# Patient Record
Sex: Male | Born: 1985 | Race: White | Hispanic: No | Marital: Married | Smoking: Never smoker
Health system: Southern US, Community
[De-identification: ages and names within clinical notes are randomized; demographics above are authoritative.]

## PROBLEM LIST (undated history)

## (undated) DIAGNOSIS — J45909 Unspecified asthma, uncomplicated: Secondary | ICD-10-CM

---
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
## 2015-11-27 ENCOUNTER — Emergency Department: Payer: Self-pay

## 2015-11-27 ENCOUNTER — Encounter: Payer: Self-pay | Admitting: Emergency Medicine

## 2015-11-27 ENCOUNTER — Emergency Department
Admission: EM | Admit: 2015-11-27 | Discharge: 2015-11-27 | Disposition: A | Discharge status: Home / Self Care | Payer: Self-pay | Attending: Emergency Medicine | Admitting: Emergency Medicine

## 2015-11-27 DIAGNOSIS — W19XXXA Unspecified fall, initial encounter: Secondary | ICD-10-CM

## 2015-11-27 DIAGNOSIS — W010XXA Fall on same level from slipping, tripping and stumbling without subsequent striking against object, initial encounter: Secondary | ICD-10-CM | POA: Insufficient documentation

## 2015-11-27 DIAGNOSIS — Y999 Unspecified external cause status: Secondary | ICD-10-CM | POA: Insufficient documentation

## 2015-11-27 DIAGNOSIS — J45909 Unspecified asthma, uncomplicated: Secondary | ICD-10-CM | POA: Insufficient documentation

## 2015-11-27 DIAGNOSIS — Y92511 Restaurant or cafe as the place of occurrence of the external cause: Secondary | ICD-10-CM | POA: Insufficient documentation

## 2015-11-27 DIAGNOSIS — Y9301 Activity, walking, marching and hiking: Secondary | ICD-10-CM | POA: Insufficient documentation

## 2015-11-27 DIAGNOSIS — S300XXA Contusion of lower back and pelvis, initial encounter: Secondary | ICD-10-CM | POA: Insufficient documentation

## 2015-11-27 DIAGNOSIS — M546 Pain in thoracic spine: Secondary | ICD-10-CM

## 2015-11-27 HISTORY — DX: Unspecified asthma, uncomplicated: J45.909

## 2015-11-27 MED ORDER — NAPROXEN 500 MG PO TABS: 500.0000 mg | ORAL_TABLET | Freq: Two times a day (BID) | ORAL | Status: DC

## 2015-11-27 MED ORDER — BACLOFEN 10 MG PO TABS: 10.0000 mg | ORAL_TABLET | Freq: Three times a day (TID) | ORAL | Status: DC

## 2015-11-27 NOTE — ED Notes (Signed)
Patient presents to the ED with neck and lower back pain.  Patient states he was at a restaurant in [place] named [employer] and they used a strange cleaning product on the floor that made it very slippery and patient slipped and fell on his back.  Patient denies hitting his head or losing consciousness.  Patient is in no obvious distress at this time.

## 2015-11-27 NOTE — ED Provider Notes (Signed)
[facility] Emergency Department Provider Note  ____________________________________________  Time seen: Approximately 12:19 PM  I have reviewed the triage vital signs and the nursing notes.   HISTORY  Chief Complaint Fall    HPI Richard Roman is a 30 y.o. male reports that he slipped and local restaurant last night while walking to the restroom. Patient states that he landed with his arms outstretched backwards hitting his upper back and lower back. States that he went home took no medication did not try heat and reports today with complaints of upper thoracic and upper lumbar pain. Denies any numbness tingling or radiation of that pain.   Past Medical History  Diagnosis Date  . Asthma     There are no active problems to display for this patient.   History reviewed. No pertinent past surgical history.  Current Outpatient Rx  Name  Route  Sig  Dispense  Refill  . baclofen (LIORESAL) 10 MG tablet   Oral   Take 1 tablet (10 mg total) by mouth 3 (three) times daily.   30 tablet   0   . naproxen (NAPROSYN) 500 MG tablet   Oral   Take 1 tablet (500 mg total) by mouth 2 (two) times daily with a meal.   60 tablet   0     Allergies Codeine  No family history on file.  Social History Social History  Substance Use Topics  . Smoking status: Never Smoker   . Smokeless tobacco: None  . Alcohol Use: No    Review of Systems Constitutional: No fever/chills Cardiovascular: Denies chest pain. Respiratory: Denies shortness of breath. Gastrointestinal: No abdominal pain.  No nausea, no vomiting.  No diarrhea.  No constipation. Genitourinary: Negative for dysuria. Musculoskeletal: Positive for back pain Skin: Negative for rash. Neurological: Negative for headaches, focal weakness or numbness.  10-point ROS otherwise negative.  ____________________________________________   PHYSICAL EXAM:  VITAL SIGNS: ED Triage Vitals  Enc Vitals  Group     BP 11/27/15 1203 147/105 mmHg     Pulse Rate 11/27/15 1203 83     Resp 11/27/15 1203 20     Temp 11/27/15 1203 98.6 F (37 C)     Temp Source 11/27/15 1203 Oral     SpO2 11/27/15 1203 98 %     Weight 11/27/15 1203 189 lb (85.73 kg)     Height 11/27/15 1203  (1.88 m)     Head Cir --      Peak Flow --      Pain Score 11/27/15 1204 7     Pain Loc --      Pain Edu? --      Excl. in GC? --     Constitutional: Alert and oriented. Well appearing and in no acute distress. Neck: No stridor.   Cardiovascular: Normal rate, regular rhythm. Grossly normal heart sounds.  Good peripheral circulation. Respiratory: Normal respiratory effort.  No retractions. Lungs CTAB. Gastrointestinal: Soft and nontender. No distention. No abdominal bruits. No CVA tenderness. Musculoskeletal: No ecchymosis or bruising noted to the back region. Point tenderness noted to the upper thoracic and lower middle lumbar area. Straight leg raise positive ambulates methodically. Neurologic:  Normal speech and language. No gross focal neurologic deficits are appreciated. No gait instability, walks methodically.. Skin:  Skin is warm, dry and intact. No rash noted. Psychiatric: Mood and affect are normal. Speech and behavior are normal.  ____________________________________________   LABS (all labs ordered are listed, but only abnormal  results are displayed)  Labs Reviewed - No data to display   RADIOLOGY  No acute osseous findings. ____________________________________________   PROCEDURES  Procedure(s) performed: None  Critical Care performed: No  ____________________________________________   INITIAL IMPRESSION / ASSESSMENT AND PLAN / ED COURSE  Pertinent labs & imaging results that were available during my care of the patient were reviewed by me and considered in my medical decision making (see chart for details).  Status post fall with thoracic and lumbar contusion. Rx given for baclofen  10 mg 3 times a day Naprosyn 500 mg twice a day. Encourage use of a heating pad and follow up with PCP as needed. Patient voices no other emergency medical complaints at this time. ____________________________________________   FINAL CLINICAL IMPRESSION(S) / ED DIAGNOSES  Final diagnoses:  Fall, initial encounter  Lumbar contusion, initial encounter  Midline thoracic back pain     This chart was dictated using voice recognition software/Dragon. Despite best efforts to proofread, errors can occur which can change the meaning. Any change was purely unintentional.   Evangeline Dakinharles M Porschea Borys, PA-C 11/27/15 1308  Emily FilbertJonathan E Williams, MD 11/27/15 [phone]

## 2015-11-27 NOTE — Discharge Instructions (Signed)
Back Pain, Adult °Back pain is very common in adults. The cause of back pain is rarely dangerous and the pain often gets better over time. The cause of your back pain may not be known. Some common causes of back pain include: °· Strain of the muscles or ligaments supporting the spine. °· Wear and tear (degeneration) of the spinal disks. °· Arthritis. °· Direct injury to the back. °For many people, back pain may return. Since back pain is rarely dangerous, most people can learn to manage this condition on their own. °HOME CARE INSTRUCTIONS °Watch your back pain for any changes. The following actions may help to lessen any discomfort you are feeling: °· Remain active. It is stressful on your back to sit or stand in one place for long periods of time. Do not sit, drive, or stand in one place for more than 30 minutes at a time. Take short walks on even surfaces as soon as you are able. Try to increase the length of time you walk each day. °· Exercise regularly as directed by your health care provider. Exercise helps your back heal faster. It also helps avoid future injury by keeping your muscles strong and flexible. °· Do not stay in bed. Resting more than 1-2 days can delay your recovery. °· Pay attention to your body when you bend and lift. The most comfortable positions are those that put less stress on your recovering back. Always use proper lifting techniques, including: °· Bending your knees. °· Keeping the load close to your body. °· Avoiding twisting. °· Find a comfortable position to sleep. Use a firm mattress and lie on your side with your knees slightly bent. If you lie on your back, put a pillow under your knees. °· Avoid feeling anxious or stressed. Stress increases muscle tension and can worsen back pain. It is important to recognize when you are anxious or stressed and learn ways to manage it, such as with exercise. °· Take medicines only as directed by your health care provider. Over-the-counter  medicines to reduce pain and inflammation are often the most helpful. Your health care provider may prescribe muscle relaxant drugs. These medicines help dull your pain so you can more quickly return to your normal activities and healthy exercise. °· Apply ice to the injured area: °· Put ice in a plastic bag. °· Place a towel between your skin and the bag. °· Leave the ice on for 20 minutes, 2-3 times a day for the first 2-3 days. After that, ice and heat may be alternated to reduce pain and spasms. °· Maintain a healthy weight. Excess weight puts extra stress on your back and makes it difficult to maintain good posture. °SEEK MEDICAL CARE IF: °· You have pain that is not relieved with rest or medicine. °· You have increasing pain going down into the legs or buttocks. °· You have pain that does not improve in one week. °· You have night pain. °· You lose weight. °· You have a fever or chills. °SEEK IMMEDIATE MEDICAL CARE IF:  °· You develop new bowel or bladder control problems. °· You have unusual weakness or numbness in your arms or legs. °· You develop nausea or vomiting. °· You develop abdominal pain. °· You feel faint. °  °This information is not intended to replace advice given to you by your health care provider. Make sure you discuss any questions you have with your health care provider. °  °Document Released: 08/11/2005 Document Revised: 09/01/2014 Document Reviewed: 12/13/2013 °Elsevier Interactive Patient Education ©2016 Elsevier   Inc.  Contusion A contusion is a deep bruise. Contusions are the result of a blunt injury to tissues and muscle fibers under the skin. The injury causes bleeding under the skin. The skin overlying the contusion may turn blue, purple, or yellow. Minor injuries will give you a painless contusion, but more severe contusions may stay painful and swollen for a few weeks.  CAUSES  This condition is usually caused by a blow, trauma, or direct force to an area of the body. SYMPTOMS   Symptoms of this condition include:  Swelling of the injured area.  Pain and tenderness in the injured area.  Discoloration. The area may have redness and then turn blue, purple, or yellow. DIAGNOSIS  This condition is diagnosed based on a physical exam and medical history. An X-ray, CT scan, or MRI may be needed to determine if there are any associated injuries, such as broken bones (fractures). TREATMENT  Specific treatment for this condition depends on what area of the body was injured. In general, the best treatment for a contusion is resting, icing, applying pressure to (compression), and elevating the injured area. This is often called the RICE strategy. Over-the-counter anti-inflammatory medicines may also be recommended for pain control.  HOME CARE INSTRUCTIONS   Rest the injured area.  If directed, apply ice to the injured area:  Put ice in a plastic bag.  Place a towel between your skin and the bag.  Leave the ice on for 20 minutes, 2-3 times per day.  If directed, apply light compression to the injured area using an elastic bandage. Make sure the bandage is not wrapped too tightly. Remove and reapply the bandage as directed by your health care provider.  If possible, raise (elevate) the injured area above the level of your heart while you are sitting or lying down.  Take over-the-counter and prescription medicines only as told by your health care provider. SEEK MEDICAL CARE IF:  Your symptoms do not improve after several days of treatment.  Your symptoms get worse.  You have difficulty moving the injured area. SEEK IMMEDIATE MEDICAL CARE IF:   You have severe pain.  You have numbness in a hand or foot.  Your hand or foot turns pale or cold.   This information is not intended to replace advice given to you by your health care provider. Make sure you discuss any questions you have with your health care provider.   Document Released: 05/21/2005 Document Revised:  05/02/2015 Document Reviewed: 12/27/2014 Elsevier Interactive Patient Education 2016 Elsevier Inc.  [employer]ockerHeat Therapy Heat therapy can help ease sore, stiff, injured, and tight muscles and joints. Heat relaxes your muscles, which may help ease your pain.  RISKS AND COMPLICATIONS If you have any of the following conditions, do not use heat therapy unless your health care provider has approved:  Poor circulation.  Healing wounds or scarred skin in the area being treated.  Diabetes, heart disease, or high blood pressure.  Not being able to feel (numbness) the area being treated.  Unusual swelling of the area being treated.  Active infections.  Blood clots.  Cancer.  Inability to communicate pain. This may include young children and people who have problems with their brain function (dementia).  Pregnancy. Heat therapy should only be used on old, pre-existing, or long-lasting (chronic) injuries. Do not use heat therapy on new injuries unless directed by your health care provider. HOW TO USE HEAT THERAPY There are several different kinds of heat therapy, including:  Moist  heat pack.  Warm water bath.  Hot water bottle.  Electric heating pad.  Heated gel pack.  Heated wrap.  Electric heating pad. Use the heat therapy method suggested by your health care provider. Follow your health care provider's instructions on when and how to use heat therapy. GENERAL HEAT THERAPY RECOMMENDATIONS  Do not sleep while using heat therapy. Only use heat therapy while you are awake.  Your skin may turn pink while using heat therapy. Do not use heat therapy if your skin turns red.  Do not use heat therapy if you have new pain.  High heat or long exposure to heat can cause burns. Be careful when using heat therapy to avoid burning your skin.  Do not use heat therapy on areas of your skin that are already irritated, such as with a rash or sunburn. SEEK MEDICAL CARE IF:  You have blisters,  redness, swelling, or numbness.  You have new pain.  Your pain is worse. MAKE SURE YOU:  Understand these instructions.  Will watch your condition.  Will get help right away if you are not doing well or get worse.   This information is not intended to replace advice given to you by your health care provider. Make sure you discuss any questions you have with your health care provider.   Document Released: 11/03/2011 Document Revised: 09/01/2014 Document Reviewed: 10/04/2013 Elsevier Interactive Patient Education 2016 Elsevier Inc.  Cryotherapy Cryotherapy is when you put ice on your injury. Ice helps lessen pain and puffiness (swelling) after an injury. Ice works the best when you start using it in the first 24 to 48 hours after an injury. HOME CARE  Put a dry or damp towel between the ice pack and your skin.  You may press gently on the ice pack.  Leave the ice on for no more than 10 to 20 minutes at a time.  Check your skin after 5 minutes to make sure your skin is okay.  Rest at least 20 minutes between ice pack uses.  Stop using ice when your skin loses feeling (numbness).  Do not use ice on someone who cannot tell you when it hurts. This includes small children and people with memory problems (dementia). GET HELP RIGHT AWAY IF:  You have white spots on your skin.  Your skin turns blue or pale.  Your skin feels waxy or hard.  Your puffiness gets worse. MAKE SURE YOU:   Understand these instructions.  Will watch your condition.  Will get help right away if you are not doing well or get worse.   This information is not intended to replace advice given to you by your health care provider. Make sure you discuss any questions you have with your health care provider.   Document Released: 01/28/2008 Document Revised: 11/03/2011 Document Reviewed: 04/03/2011 Elsevier Interactive Patient Education [employer]

## 2016-09-18 IMAGING — CR DG THORACIC SPINE 2V
1 series · 4 of 4 positions shown · non-contrast
Comparison: None.

CLINICAL DATA: Acute upper thoracic spine after fall. Initial
encounter.

EXAM:
THORACIC SPINE 2 VIEWS

[Series 1: t thoracic spine ap · 0.14mm/px · 4 of 4 slices shown]
[im 1/4]
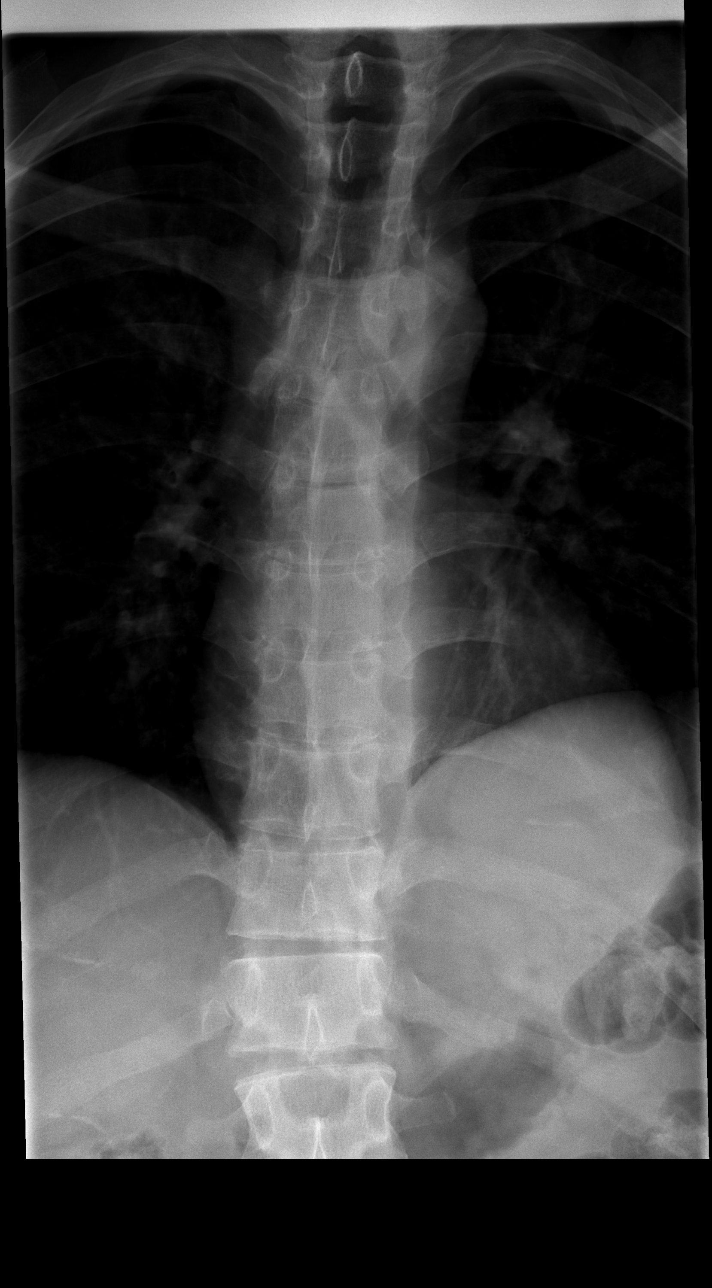
[im 2/4]
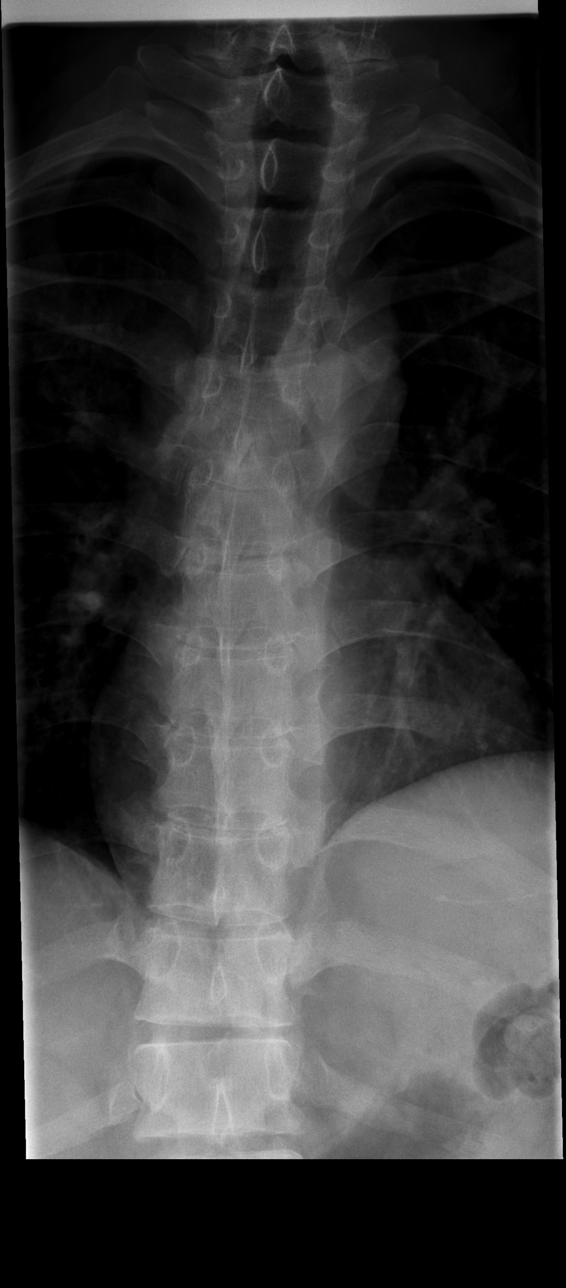
[im 3/4]
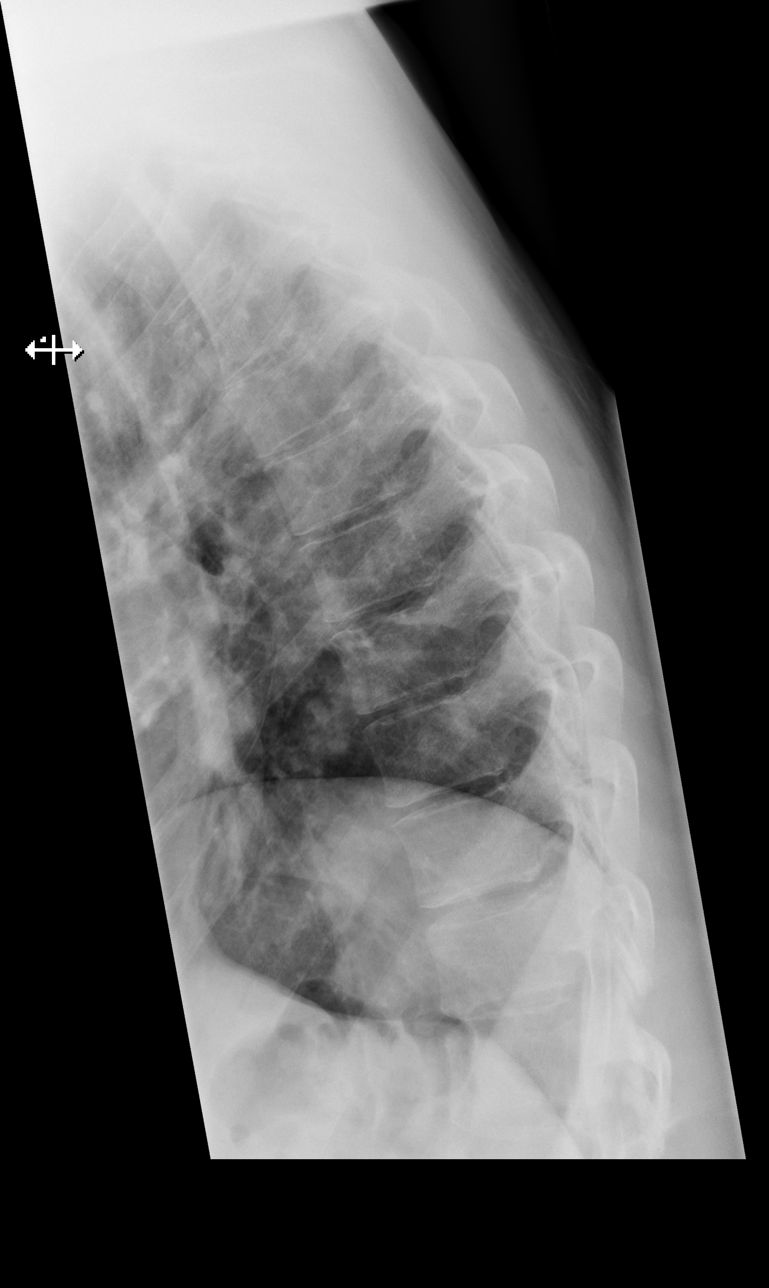
[im 4/4]
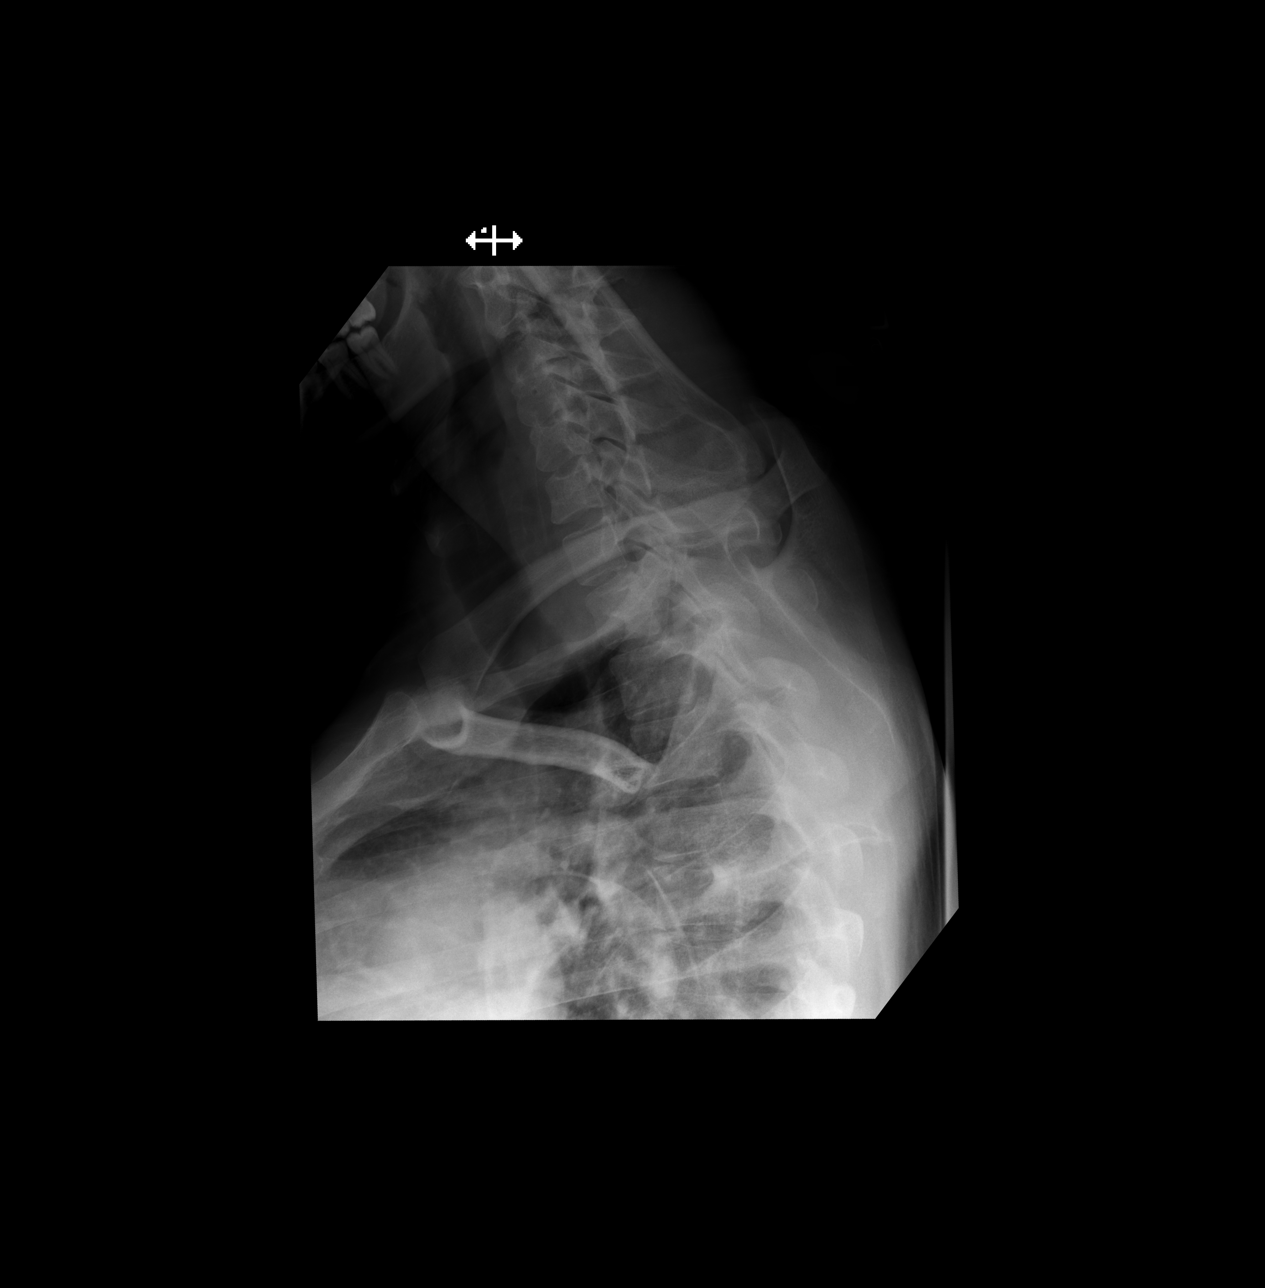

[4 of 4 positions shown; findings below may reference images not displayed]

FINDINGS: There is no evidence of thoracic spine fracture. Alignment is
normal. No other significant bone abnormalities are identified.
IMPRESSION: Normal thoracic spine.

## 2016-11-24 DIAGNOSIS — H1713 Central corneal opacity, bilateral: Secondary | ICD-10-CM | POA: Diagnosis not present

## 2016-11-24 DIAGNOSIS — H11013 Amyloid pterygium of eye, bilateral: Secondary | ICD-10-CM | POA: Diagnosis not present

## 2016-11-24 DIAGNOSIS — H209 Unspecified iridocyclitis: Secondary | ICD-10-CM | POA: Diagnosis not present

## 2016-11-25 DIAGNOSIS — H11003 Unspecified pterygium of eye, bilateral: Secondary | ICD-10-CM | POA: Diagnosis not present

## 2016-11-25 DIAGNOSIS — H21541 Posterior synechiae (iris), right eye: Secondary | ICD-10-CM | POA: Diagnosis not present

## 2016-11-25 DIAGNOSIS — H30033 Focal chorioretinal inflammation, peripheral, bilateral: Secondary | ICD-10-CM | POA: Diagnosis not present

## 2016-11-25 DIAGNOSIS — H2513 Age-related nuclear cataract, bilateral: Secondary | ICD-10-CM | POA: Diagnosis not present

## 2016-12-01 DIAGNOSIS — H18453 Nodular corneal degeneration, bilateral: Secondary | ICD-10-CM | POA: Diagnosis not present

## 2016-12-01 DIAGNOSIS — H11003 Unspecified pterygium of eye, bilateral: Secondary | ICD-10-CM | POA: Diagnosis not present

## 2016-12-09 DIAGNOSIS — H21541 Posterior synechiae (iris), right eye: Secondary | ICD-10-CM | POA: Diagnosis not present

## 2016-12-09 DIAGNOSIS — H30033 Focal chorioretinal inflammation, peripheral, bilateral: Secondary | ICD-10-CM | POA: Diagnosis not present

## 2016-12-09 DIAGNOSIS — H2513 Age-related nuclear cataract, bilateral: Secondary | ICD-10-CM | POA: Diagnosis not present

## 2016-12-09 DIAGNOSIS — H11003 Unspecified pterygium of eye, bilateral: Secondary | ICD-10-CM | POA: Diagnosis not present

## 2016-12-25 DIAGNOSIS — H11013 Amyloid pterygium of eye, bilateral: Secondary | ICD-10-CM | POA: Diagnosis not present

## 2016-12-25 DIAGNOSIS — H209 Unspecified iridocyclitis: Secondary | ICD-10-CM | POA: Diagnosis not present

## 2016-12-25 DIAGNOSIS — H1713 Central corneal opacity, bilateral: Secondary | ICD-10-CM | POA: Diagnosis not present

## 2016-12-30 DIAGNOSIS — H21541 Posterior synechiae (iris), right eye: Secondary | ICD-10-CM | POA: Diagnosis not present

## 2016-12-30 DIAGNOSIS — H30033 Focal chorioretinal inflammation, peripheral, bilateral: Secondary | ICD-10-CM | POA: Diagnosis not present

## 2016-12-30 DIAGNOSIS — H2513 Age-related nuclear cataract, bilateral: Secondary | ICD-10-CM | POA: Diagnosis not present

## 2016-12-30 DIAGNOSIS — H11003 Unspecified pterygium of eye, bilateral: Secondary | ICD-10-CM | POA: Diagnosis not present

## 2016-12-31 DIAGNOSIS — H2513 Age-related nuclear cataract, bilateral: Secondary | ICD-10-CM | POA: Diagnosis not present

## 2016-12-31 DIAGNOSIS — H40042 Steroid responder, left eye: Secondary | ICD-10-CM | POA: Diagnosis not present

## 2016-12-31 DIAGNOSIS — H18453 Nodular corneal degeneration, bilateral: Secondary | ICD-10-CM | POA: Diagnosis not present

## 2017-01-20 DIAGNOSIS — F909 Attention-deficit hyperactivity disorder, unspecified type: Secondary | ICD-10-CM | POA: Diagnosis not present

## 2017-01-20 DIAGNOSIS — H209 Unspecified iridocyclitis: Secondary | ICD-10-CM | POA: Diagnosis not present

## 2017-01-20 DIAGNOSIS — M1280 Other specific arthropathies, not elsewhere classified, unspecified site: Secondary | ICD-10-CM | POA: Diagnosis not present

## 2017-01-20 DIAGNOSIS — J4520 Mild intermittent asthma, uncomplicated: Secondary | ICD-10-CM | POA: Diagnosis not present

## 2017-01-22 DIAGNOSIS — H11003 Unspecified pterygium of eye, bilateral: Secondary | ICD-10-CM | POA: Diagnosis not present

## 2017-01-22 DIAGNOSIS — H21541 Posterior synechiae (iris), right eye: Secondary | ICD-10-CM | POA: Diagnosis not present

## 2017-01-22 DIAGNOSIS — H2513 Age-related nuclear cataract, bilateral: Secondary | ICD-10-CM | POA: Diagnosis not present

## 2017-01-22 DIAGNOSIS — H30033 Focal chorioretinal inflammation, peripheral, bilateral: Secondary | ICD-10-CM | POA: Diagnosis not present

## 2017-01-27 ENCOUNTER — Ambulatory Visit (INDEPENDENT_AMBULATORY_CARE_PROVIDER_SITE_OTHER): Payer: BLUE CROSS/BLUE SHIELD | Admitting: Allergy and Immunology

## 2017-01-27 ENCOUNTER — Encounter: Payer: Self-pay | Admitting: Allergy and Immunology

## 2017-01-27 ENCOUNTER — Telehealth: Payer: Self-pay | Admitting: *Deleted

## 2017-01-27 VITALS — BP 122/78 | HR 80 | Temp 97.5°F | Resp 18 | Ht 73.0 in | Wt 196.4 lb

## 2017-01-27 DIAGNOSIS — H209 Unspecified iridocyclitis: Secondary | ICD-10-CM | POA: Diagnosis not present

## 2017-01-27 DIAGNOSIS — M4690 Unspecified inflammatory spondylopathy, site unspecified: Secondary | ICD-10-CM | POA: Diagnosis not present

## 2017-01-27 DIAGNOSIS — H1010 Acute atopic conjunctivitis, unspecified eye: Secondary | ICD-10-CM

## 2017-01-27 DIAGNOSIS — H1045 Other chronic allergic conjunctivitis: Secondary | ICD-10-CM

## 2017-01-27 DIAGNOSIS — M47819 Spondylosis without myelopathy or radiculopathy, site unspecified: Secondary | ICD-10-CM

## 2017-01-27 MED ORDER — OLOPATADINE HCL 0.7 % OP SOLN: 1.0000 [drp] | Freq: Every day | OPHTHALMIC | 5 refills | Status: AC

## 2017-01-27 NOTE — Telephone Encounter (Signed)
Dee could you please refer patient to Dr. Dierdre ForthBeekman rheumatologist please per Dr Lucie LeatherKozlow

## 2017-01-27 NOTE — Progress Notes (Signed)
NEW PATIENT NOTE  Referring Provider: No ref. provider found Primary Provider: Patient, No Pcp Per Date of office visit: 01/27/2017    Subjective:   Chief Complaint:  Richard Roman (DOB: 11/30/1985) is a 31 y.o. male who presents to the clinic on 01/27/2017 with a chief complaint of Other (Chronic inflammation ) and Allergic Rhinitis  (seasonal) .  HPI: Richard Roman presents to this clinic in evaluation of possible allergies involving his eyes. Apparently he has corneal nodules and uveitis followed by a ophthalmologist who is treated him recently with systemic steroids of approximately 1 month's duration to address this issue. At the same time that he received systemic steroids he also received what sounds like high potency topical steroid eyedrops. This plan appeared to work well although it did end up raising his eye pressure quite significantly and at this point in time he is not on any immunosuppressive agents either orally or topically. 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 He does have back stiffness and back pain on a regular basis although with his prednisone everything has resolved.  He does have very itchy eyes and a lot of eye rubbing of many years duration. He has noticed some seasonal variation with worsening of this condition during the fall. He is wondering if atopic disease is contributing to some of this issue.  As well, because of his autoimmune disease which appears to be some type of spondyloarthropathy and uveitis he is wondering if there is a food trigger giving rise to this issue. He has undergone a anti-inflammatory diet and has eliminated all grains from his diet.  Past Medical History:  Diagnosis Date  . Asthma     History reviewed. No pertinent surgical history.  Allergies as of 01/27/2017      Reactions     Codeine Other (See Comments)   hallucinations      Medication List      PROAIR HFA 108 (90 Base) MCG/ACT inhaler Generic drug:  albuterol inhale 2 puffs by mouth every 4 hours if needed for wheezing   VITAMIN D PO Take by mouth.   VITAMIN E PO Take by mouth.       Review of systems negative except as noted in HPI / PMHx or noted below:  Review of Systems  Constitutional: Negative.   HENT: Negative.   Eyes: Negative.   Respiratory: Negative.   Cardiovascular: Negative.   Gastrointestinal: Negative.   Genitourinary: Negative.   Musculoskeletal: Negative.   Skin: Negative.   Neurological: Negative.   Endo/Heme/Allergies: Negative.   Psychiatric/Behavioral: Negative.     Family History  Problem Relation Age of Onset  . Asthma Mother   . Rheum arthritis Father   . Asthma Brother     Social History   Social History  . Marital status: Married    Spouse name: N/A  . Number of children: N/A  . Years of education: N/A   Occupational History  . Not on file.   Social History Main Topics  . Smoking status: Never Smoker  . Smokeless tobacco: Never Used  . Alcohol use No  . Drug use: Unknown  . Sexual activity: Not on file   Other Topics Concern  . Not on file   Social History Narrative  . No narrative on file    Environmental and Social  history  Lives in a house with a dry environment, no animals located inside the household, no carpeting in the bedroom, no plastic on the bed but plastic on the pillow, no smoking ongoing inside the household. He is a Probation officer.  Objective:   Vitals:   01/27/17 0821  BP: 122/78  Pulse: 80  Resp: 18  Temp: 97.5 F (36.4 C)   Height: 6' 1"  (185.4 cm) Weight: 196 lb 6.4 oz (89.1 kg)  Physical Exam  Constitutional: He is well-developed, well-nourished, and in no distress.  HENT:  Head: Normocephalic. Head is without right periorbital erythema and without left periorbital erythema.  Right Ear: Tympanic membrane,  external ear and ear canal normal.  Left Ear: Tympanic membrane, external ear and ear canal normal.  Nose: Nose normal. No mucosal edema or rhinorrhea.  Mouth/Throat: Uvula is midline, oropharynx is clear and moist and mucous membranes are normal. No oropharyngeal exudate.  Eyes: Conjunctivae and lids are normal. Pupils are equal, round, and reactive to light.  Neck: Trachea normal. No tracheal tenderness present. No tracheal deviation present. No thyromegaly present.  Cardiovascular: Normal rate, regular rhythm, S1 normal, S2 normal and normal heart sounds.   No murmur heard. Pulmonary/Chest: Effort normal and breath sounds normal. No stridor. No tachypnea. No respiratory distress. He has no wheezes. He has no rales. He exhibits no tenderness.  Abdominal: Soft. He exhibits no distension and no mass. There is no hepatosplenomegaly. There is no tenderness. There is no rebound and no guarding.  Musculoskeletal: He exhibits no edema or tenderness.  Lymphadenopathy:       Head (right side): No tonsillar adenopathy present.       Head (left side): No tonsillar adenopathy present.    He has no cervical adenopathy.    He has no axillary adenopathy.  Neurological: He is alert. Gait normal.  Skin: No rash noted. He is not diaphoretic. No erythema. No pallor. Nails show no clubbing.  Psychiatric: Mood and affect normal.    Diagnostics: Allergy skin tests were performed. He demonstrated hypersensitivity to grasses, trees, weeds, molds, house dust mite, and cat. He did not demonstrate any hypersensitivity against foods.  Assessment and Plan:    1. Seasonal allergic conjunctivitis   2. Uveitis   3. Spondyloarthropathy ([place])     1. Allergen avoidance measures  2. Can utilize has Pazeo one drop each eye one time per day  3. Recommend a combination of medical care directed by both ophthalmologist and rheumatologist in anticipation of starting an immunomodulatory agent  4. Further evaluation and  treatment?  5. Call with update in the next 3 weeks.  For Steven's atopic disease he can perform allergen avoidance measures and use a topical antihistamine for his conjunctival inflammation. We will see how things go regarding this issue over the course of the next month and consider further evaluation and treatment pending his response. I am concerned that he will redevelop his uveitis and spondyloarthropathy once he loses the effect that he received from his month long course of systemic steroids and I suspect that he will require some type of immunomodulatory agent. I recommended that he continue to see his ophthalmologist but also visit with a rheumatologist to address this issue.  Allena Katz, MD Allergy / Immunology [place]

## 2017-01-27 NOTE — Patient Instructions (Signed)
  1. Allergen avoidance measures  2. Can utilize has Pazeo one drop each eye one time per day  3. Recommend a combination of medical care directed by both ophthalmologist and rheumatologist in anticipation of starting an immunomodulatory agent  4. Further evaluation and treatment?  5. Call with update in the next 3 weeks.

## 2017-01-28 NOTE — Telephone Encounter (Signed)
[facility] will contact patient to set up an appt. I will call the patient to let him know his records have been sent over.   Thanks

## 2017-01-28 NOTE — Telephone Encounter (Signed)
noted 

## 2017-01-30 DIAGNOSIS — J45909 Unspecified asthma, uncomplicated: Secondary | ICD-10-CM | POA: Diagnosis not present

## 2017-01-30 DIAGNOSIS — H18453 Nodular corneal degeneration, bilateral: Secondary | ICD-10-CM | POA: Diagnosis not present

## 2017-01-30 DIAGNOSIS — H40042 Steroid responder, left eye: Secondary | ICD-10-CM | POA: Diagnosis not present

## 2017-01-30 DIAGNOSIS — Z87891 Personal history of nicotine dependence: Secondary | ICD-10-CM | POA: Diagnosis not present

## 2017-01-30 DIAGNOSIS — L905 Scar conditions and fibrosis of skin: Secondary | ICD-10-CM | POA: Diagnosis not present

## 2017-01-30 DIAGNOSIS — Z885 Allergy status to narcotic agent status: Secondary | ICD-10-CM | POA: Diagnosis not present

## 2017-01-30 DIAGNOSIS — H2513 Age-related nuclear cataract, bilateral: Secondary | ICD-10-CM | POA: Diagnosis not present

## 2017-02-10 DIAGNOSIS — H2513 Age-related nuclear cataract, bilateral: Secondary | ICD-10-CM | POA: Diagnosis not present

## 2017-02-10 DIAGNOSIS — H11003 Unspecified pterygium of eye, bilateral: Secondary | ICD-10-CM | POA: Diagnosis not present

## 2017-02-10 DIAGNOSIS — H21541 Posterior synechiae (iris), right eye: Secondary | ICD-10-CM | POA: Diagnosis not present

## 2017-02-10 DIAGNOSIS — H30033 Focal chorioretinal inflammation, peripheral, bilateral: Secondary | ICD-10-CM | POA: Diagnosis not present

## 2017-03-23 DIAGNOSIS — H40042 Steroid responder, left eye: Secondary | ICD-10-CM | POA: Diagnosis not present

## 2017-03-23 DIAGNOSIS — H18452 Nodular corneal degeneration, left eye: Secondary | ICD-10-CM | POA: Diagnosis not present

## 2017-03-23 DIAGNOSIS — H18453 Nodular corneal degeneration, bilateral: Secondary | ICD-10-CM | POA: Diagnosis not present

## 2017-03-23 DIAGNOSIS — H2513 Age-related nuclear cataract, bilateral: Secondary | ICD-10-CM | POA: Diagnosis not present

## 2017-04-09 DIAGNOSIS — M2550 Pain in unspecified joint: Secondary | ICD-10-CM | POA: Diagnosis not present

## 2017-04-09 DIAGNOSIS — H209 Unspecified iridocyclitis: Secondary | ICD-10-CM | POA: Diagnosis not present

## 2017-04-09 DIAGNOSIS — M461 Sacroiliitis, not elsewhere classified: Secondary | ICD-10-CM | POA: Diagnosis not present

## 2017-04-09 DIAGNOSIS — Z1589 Genetic susceptibility to other disease: Secondary | ICD-10-CM | POA: Diagnosis not present

## 2017-04-22 DIAGNOSIS — F909 Attention-deficit hyperactivity disorder, unspecified type: Secondary | ICD-10-CM | POA: Diagnosis not present

## 2017-04-22 DIAGNOSIS — J069 Acute upper respiratory infection, unspecified: Secondary | ICD-10-CM | POA: Diagnosis not present

## 2017-04-22 DIAGNOSIS — J4520 Mild intermittent asthma, uncomplicated: Secondary | ICD-10-CM | POA: Diagnosis not present

## 2017-04-22 DIAGNOSIS — M1280 Other specific arthropathies, not elsewhere classified, unspecified site: Secondary | ICD-10-CM | POA: Diagnosis not present

## 2017-05-12 DIAGNOSIS — Z947 Corneal transplant status: Secondary | ICD-10-CM | POA: Diagnosis not present

## 2017-05-12 DIAGNOSIS — H40042 Steroid responder, left eye: Secondary | ICD-10-CM | POA: Diagnosis not present

## 2017-05-12 DIAGNOSIS — Z79899 Other long term (current) drug therapy: Secondary | ICD-10-CM | POA: Diagnosis not present

## 2017-05-12 DIAGNOSIS — H2513 Age-related nuclear cataract, bilateral: Secondary | ICD-10-CM | POA: Diagnosis not present

## 2017-05-12 DIAGNOSIS — Z48810 Encounter for surgical aftercare following surgery on the sense organs: Secondary | ICD-10-CM | POA: Diagnosis not present

## 2017-05-12 DIAGNOSIS — Z87891 Personal history of nicotine dependence: Secondary | ICD-10-CM | POA: Diagnosis not present

## 2017-05-12 DIAGNOSIS — Z885 Allergy status to narcotic agent status: Secondary | ICD-10-CM | POA: Diagnosis not present

## 2017-05-12 DIAGNOSIS — H18451 Nodular corneal degeneration, right eye: Secondary | ICD-10-CM | POA: Diagnosis not present

## 2017-05-12 DIAGNOSIS — H18453 Nodular corneal degeneration, bilateral: Secondary | ICD-10-CM | POA: Diagnosis not present

## 2017-05-12 DIAGNOSIS — J45909 Unspecified asthma, uncomplicated: Secondary | ICD-10-CM | POA: Diagnosis not present

## 2017-05-19 DIAGNOSIS — H21541 Posterior synechiae (iris), right eye: Secondary | ICD-10-CM | POA: Diagnosis not present

## 2017-05-19 DIAGNOSIS — H11003 Unspecified pterygium of eye, bilateral: Secondary | ICD-10-CM | POA: Diagnosis not present

## 2017-05-19 DIAGNOSIS — H2513 Age-related nuclear cataract, bilateral: Secondary | ICD-10-CM | POA: Diagnosis not present

## 2017-05-19 DIAGNOSIS — H30033 Focal chorioretinal inflammation, peripheral, bilateral: Secondary | ICD-10-CM | POA: Diagnosis not present

## 2017-05-21 DIAGNOSIS — H209 Unspecified iridocyclitis: Secondary | ICD-10-CM | POA: Diagnosis not present

## 2017-05-21 DIAGNOSIS — M450 Ankylosing spondylitis of multiple sites in spine: Secondary | ICD-10-CM | POA: Diagnosis not present

## 2017-07-29 DIAGNOSIS — H40042 Steroid responder, left eye: Secondary | ICD-10-CM | POA: Diagnosis not present

## 2017-07-29 DIAGNOSIS — H2513 Age-related nuclear cataract, bilateral: Secondary | ICD-10-CM | POA: Diagnosis not present

## 2017-07-29 DIAGNOSIS — H18453 Nodular corneal degeneration, bilateral: Secondary | ICD-10-CM | POA: Diagnosis not present

## 2017-07-30 DIAGNOSIS — M450 Ankylosing spondylitis of multiple sites in spine: Secondary | ICD-10-CM | POA: Diagnosis not present

## 2017-07-30 DIAGNOSIS — H209 Unspecified iridocyclitis: Secondary | ICD-10-CM | POA: Diagnosis not present

## 2017-07-30 DIAGNOSIS — M461 Sacroiliitis, not elsewhere classified: Secondary | ICD-10-CM | POA: Diagnosis not present

## 2017-08-13 DIAGNOSIS — M450 Ankylosing spondylitis of multiple sites in spine: Secondary | ICD-10-CM | POA: Diagnosis not present

## 2017-09-30 DIAGNOSIS — H18453 Nodular corneal degeneration, bilateral: Secondary | ICD-10-CM | POA: Diagnosis not present

## 2017-09-30 DIAGNOSIS — H40042 Steroid responder, left eye: Secondary | ICD-10-CM | POA: Diagnosis not present

## 2017-09-30 DIAGNOSIS — H2513 Age-related nuclear cataract, bilateral: Secondary | ICD-10-CM | POA: Diagnosis not present

## 2017-10-09 DIAGNOSIS — H2513 Age-related nuclear cataract, bilateral: Secondary | ICD-10-CM | POA: Diagnosis not present

## 2017-10-09 DIAGNOSIS — H21541 Posterior synechiae (iris), right eye: Secondary | ICD-10-CM | POA: Diagnosis not present

## 2017-10-09 DIAGNOSIS — H30033 Focal chorioretinal inflammation, peripheral, bilateral: Secondary | ICD-10-CM | POA: Diagnosis not present

## 2017-10-09 DIAGNOSIS — H11003 Unspecified pterygium of eye, bilateral: Secondary | ICD-10-CM | POA: Diagnosis not present

## 2017-12-02 DIAGNOSIS — J4520 Mild intermittent asthma, uncomplicated: Secondary | ICD-10-CM | POA: Diagnosis not present

## 2017-12-02 DIAGNOSIS — E663 Overweight: Secondary | ICD-10-CM | POA: Diagnosis not present

## 2017-12-02 DIAGNOSIS — J302 Other seasonal allergic rhinitis: Secondary | ICD-10-CM | POA: Diagnosis not present

## 2017-12-02 DIAGNOSIS — F909 Attention-deficit hyperactivity disorder, unspecified type: Secondary | ICD-10-CM | POA: Diagnosis not present

## 2018-01-01 DIAGNOSIS — H209 Unspecified iridocyclitis: Secondary | ICD-10-CM | POA: Diagnosis not present

## 2018-01-01 DIAGNOSIS — M450 Ankylosing spondylitis of multiple sites in spine: Secondary | ICD-10-CM | POA: Diagnosis not present

## 2018-01-01 DIAGNOSIS — M461 Sacroiliitis, not elsewhere classified: Secondary | ICD-10-CM | POA: Diagnosis not present

## 2018-02-12 DIAGNOSIS — H11003 Unspecified pterygium of eye, bilateral: Secondary | ICD-10-CM | POA: Diagnosis not present

## 2018-02-12 DIAGNOSIS — H2513 Age-related nuclear cataract, bilateral: Secondary | ICD-10-CM | POA: Diagnosis not present

## 2018-02-12 DIAGNOSIS — H21541 Posterior synechiae (iris), right eye: Secondary | ICD-10-CM | POA: Diagnosis not present

## 2018-02-12 DIAGNOSIS — H30033 Focal chorioretinal inflammation, peripheral, bilateral: Secondary | ICD-10-CM | POA: Diagnosis not present

## 2018-02-24 DIAGNOSIS — H2513 Age-related nuclear cataract, bilateral: Secondary | ICD-10-CM | POA: Diagnosis not present

## 2018-02-24 DIAGNOSIS — H18453 Nodular corneal degeneration, bilateral: Secondary | ICD-10-CM | POA: Diagnosis not present

## 2018-02-24 DIAGNOSIS — H40042 Steroid responder, left eye: Secondary | ICD-10-CM | POA: Diagnosis not present

## 2018-06-28 DIAGNOSIS — Z23 Encounter for immunization: Secondary | ICD-10-CM | POA: Diagnosis not present

## 2018-06-28 DIAGNOSIS — J4520 Mild intermittent asthma, uncomplicated: Secondary | ICD-10-CM | POA: Diagnosis not present

## 2018-06-28 DIAGNOSIS — F909 Attention-deficit hyperactivity disorder, unspecified type: Secondary | ICD-10-CM | POA: Diagnosis not present

## 2018-06-28 DIAGNOSIS — L649 Androgenic alopecia, unspecified: Secondary | ICD-10-CM | POA: Diagnosis not present

## 2018-08-06 DIAGNOSIS — H40042 Steroid responder, left eye: Secondary | ICD-10-CM | POA: Diagnosis not present

## 2018-08-06 DIAGNOSIS — H2513 Age-related nuclear cataract, bilateral: Secondary | ICD-10-CM | POA: Diagnosis not present

## 2018-08-06 DIAGNOSIS — H18453 Nodular corneal degeneration, bilateral: Secondary | ICD-10-CM | POA: Diagnosis not present

## 2018-09-01 DIAGNOSIS — H209 Unspecified iridocyclitis: Secondary | ICD-10-CM | POA: Diagnosis not present

## 2018-09-01 DIAGNOSIS — M461 Sacroiliitis, not elsewhere classified: Secondary | ICD-10-CM | POA: Diagnosis not present

## 2018-09-01 DIAGNOSIS — M450 Ankylosing spondylitis of multiple sites in spine: Secondary | ICD-10-CM | POA: Diagnosis not present

## 2018-09-17 DIAGNOSIS — M450 Ankylosing spondylitis of multiple sites in spine: Secondary | ICD-10-CM | POA: Diagnosis not present

## 2018-11-12 DIAGNOSIS — H18453 Nodular corneal degeneration, bilateral: Secondary | ICD-10-CM | POA: Diagnosis not present

## 2018-11-12 DIAGNOSIS — H11003 Unspecified pterygium of eye, bilateral: Secondary | ICD-10-CM | POA: Diagnosis not present

## 2018-11-12 DIAGNOSIS — H30033 Focal chorioretinal inflammation, peripheral, bilateral: Secondary | ICD-10-CM | POA: Diagnosis not present

## 2018-11-12 DIAGNOSIS — H2513 Age-related nuclear cataract, bilateral: Secondary | ICD-10-CM | POA: Diagnosis not present

## 2018-11-15 DIAGNOSIS — H18453 Nodular corneal degeneration, bilateral: Secondary | ICD-10-CM | POA: Diagnosis not present

## 2018-11-15 DIAGNOSIS — H40042 Steroid responder, left eye: Secondary | ICD-10-CM | POA: Diagnosis not present

## 2018-11-15 DIAGNOSIS — H2513 Age-related nuclear cataract, bilateral: Secondary | ICD-10-CM | POA: Diagnosis not present

## 2018-12-27 DIAGNOSIS — L649 Androgenic alopecia, unspecified: Secondary | ICD-10-CM | POA: Diagnosis not present

## 2018-12-27 DIAGNOSIS — F909 Attention-deficit hyperactivity disorder, unspecified type: Secondary | ICD-10-CM | POA: Diagnosis not present

## 2019-03-09 DIAGNOSIS — M450 Ankylosing spondylitis of multiple sites in spine: Secondary | ICD-10-CM | POA: Diagnosis not present

## 2019-03-15 DIAGNOSIS — M450 Ankylosing spondylitis of multiple sites in spine: Secondary | ICD-10-CM | POA: Diagnosis not present

## 2019-03-15 DIAGNOSIS — H209 Unspecified iridocyclitis: Secondary | ICD-10-CM | POA: Diagnosis not present

## 2019-03-15 DIAGNOSIS — M461 Sacroiliitis, not elsewhere classified: Secondary | ICD-10-CM | POA: Diagnosis not present

## 2019-09-19 DIAGNOSIS — M461 Sacroiliitis, not elsewhere classified: Secondary | ICD-10-CM | POA: Diagnosis not present

## 2019-09-19 DIAGNOSIS — M450 Ankylosing spondylitis of multiple sites in spine: Secondary | ICD-10-CM | POA: Diagnosis not present

## 2019-09-19 DIAGNOSIS — H209 Unspecified iridocyclitis: Secondary | ICD-10-CM | POA: Diagnosis not present

## 2019-09-28 DIAGNOSIS — Z111 Encounter for screening for respiratory tuberculosis: Secondary | ICD-10-CM | POA: Diagnosis not present

## 2019-09-28 DIAGNOSIS — M450 Ankylosing spondylitis of multiple sites in spine: Secondary | ICD-10-CM | POA: Diagnosis not present

## 2019-09-30 DIAGNOSIS — F909 Attention-deficit hyperactivity disorder, unspecified type: Secondary | ICD-10-CM | POA: Diagnosis not present

## 2019-09-30 DIAGNOSIS — L649 Androgenic alopecia, unspecified: Secondary | ICD-10-CM | POA: Diagnosis not present

## 2020-04-12 DIAGNOSIS — M461 Sacroiliitis, not elsewhere classified: Secondary | ICD-10-CM | POA: Diagnosis not present

## 2020-04-12 DIAGNOSIS — H209 Unspecified iridocyclitis: Secondary | ICD-10-CM | POA: Diagnosis not present

## 2020-04-12 DIAGNOSIS — M450 Ankylosing spondylitis of multiple sites in spine: Secondary | ICD-10-CM | POA: Diagnosis not present

## 2020-04-24 DIAGNOSIS — F909 Attention-deficit hyperactivity disorder, unspecified type: Secondary | ICD-10-CM | POA: Diagnosis not present

## 2020-04-24 DIAGNOSIS — M1280 Other specific arthropathies, not elsewhere classified, unspecified site: Secondary | ICD-10-CM | POA: Diagnosis not present

## 2020-04-24 DIAGNOSIS — K219 Gastro-esophageal reflux disease without esophagitis: Secondary | ICD-10-CM | POA: Diagnosis not present

## 2020-11-19 DIAGNOSIS — H209 Unspecified iridocyclitis: Secondary | ICD-10-CM | POA: Diagnosis not present

## 2020-11-19 DIAGNOSIS — M461 Sacroiliitis, not elsewhere classified: Secondary | ICD-10-CM | POA: Diagnosis not present

## 2020-11-19 DIAGNOSIS — Z111 Encounter for screening for respiratory tuberculosis: Secondary | ICD-10-CM | POA: Diagnosis not present

## 2020-11-19 DIAGNOSIS — M450 Ankylosing spondylitis of multiple sites in spine: Secondary | ICD-10-CM | POA: Diagnosis not present

## 2020-11-20 DIAGNOSIS — F909 Attention-deficit hyperactivity disorder, unspecified type: Secondary | ICD-10-CM | POA: Diagnosis not present

## 2020-11-20 DIAGNOSIS — J302 Other seasonal allergic rhinitis: Secondary | ICD-10-CM | POA: Diagnosis not present

## 2020-11-20 DIAGNOSIS — L649 Androgenic alopecia, unspecified: Secondary | ICD-10-CM | POA: Diagnosis not present

## 2021-05-21 DIAGNOSIS — M450 Ankylosing spondylitis of multiple sites in spine: Secondary | ICD-10-CM | POA: Diagnosis not present

## 2021-05-21 DIAGNOSIS — M542 Cervicalgia: Secondary | ICD-10-CM | POA: Diagnosis not present

## 2021-05-21 DIAGNOSIS — Z111 Encounter for screening for respiratory tuberculosis: Secondary | ICD-10-CM | POA: Diagnosis not present

## 2021-05-21 DIAGNOSIS — M461 Sacroiliitis, not elsewhere classified: Secondary | ICD-10-CM | POA: Diagnosis not present

## 2021-05-21 DIAGNOSIS — H209 Unspecified iridocyclitis: Secondary | ICD-10-CM | POA: Diagnosis not present

## 2021-06-19 DIAGNOSIS — J4520 Mild intermittent asthma, uncomplicated: Secondary | ICD-10-CM | POA: Diagnosis not present

## 2021-06-19 DIAGNOSIS — J302 Other seasonal allergic rhinitis: Secondary | ICD-10-CM | POA: Diagnosis not present

## 2021-06-19 DIAGNOSIS — F909 Attention-deficit hyperactivity disorder, unspecified type: Secondary | ICD-10-CM | POA: Diagnosis not present

## 2021-06-19 DIAGNOSIS — M542 Cervicalgia: Secondary | ICD-10-CM | POA: Diagnosis not present

## 2021-07-11 DIAGNOSIS — M4722 Other spondylosis with radiculopathy, cervical region: Secondary | ICD-10-CM | POA: Diagnosis not present

## 2021-07-23 DIAGNOSIS — M4722 Other spondylosis with radiculopathy, cervical region: Secondary | ICD-10-CM | POA: Diagnosis not present

## 2021-09-27 DIAGNOSIS — H18459 Nodular corneal degeneration, unspecified eye: Secondary | ICD-10-CM | POA: Diagnosis not present

## 2021-09-27 DIAGNOSIS — M450 Ankylosing spondylitis of multiple sites in spine: Secondary | ICD-10-CM | POA: Diagnosis not present

## 2021-09-27 DIAGNOSIS — H2513 Age-related nuclear cataract, bilateral: Secondary | ICD-10-CM | POA: Diagnosis not present

## 2021-10-02 DIAGNOSIS — H2513 Age-related nuclear cataract, bilateral: Secondary | ICD-10-CM | POA: Diagnosis not present

## 2021-10-02 DIAGNOSIS — M450 Ankylosing spondylitis of multiple sites in spine: Secondary | ICD-10-CM | POA: Diagnosis not present

## 2021-10-02 DIAGNOSIS — H18459 Nodular corneal degeneration, unspecified eye: Secondary | ICD-10-CM | POA: Diagnosis not present

## 2021-12-25 DIAGNOSIS — H2513 Age-related nuclear cataract, bilateral: Secondary | ICD-10-CM | POA: Diagnosis not present

## 2021-12-25 DIAGNOSIS — M450 Ankylosing spondylitis of multiple sites in spine: Secondary | ICD-10-CM | POA: Diagnosis not present

## 2021-12-25 DIAGNOSIS — H18459 Nodular corneal degeneration, unspecified eye: Secondary | ICD-10-CM | POA: Diagnosis not present

## 2021-12-25 DIAGNOSIS — H16299 Other keratoconjunctivitis, unspecified eye: Secondary | ICD-10-CM | POA: Diagnosis not present

## 2022-01-15 DIAGNOSIS — H209 Unspecified iridocyclitis: Secondary | ICD-10-CM | POA: Diagnosis not present

## 2022-01-15 DIAGNOSIS — R5382 Chronic fatigue, unspecified: Secondary | ICD-10-CM | POA: Diagnosis not present

## 2022-01-15 DIAGNOSIS — M450 Ankylosing spondylitis of multiple sites in spine: Secondary | ICD-10-CM | POA: Diagnosis not present

## 2022-01-15 DIAGNOSIS — Z111 Encounter for screening for respiratory tuberculosis: Secondary | ICD-10-CM | POA: Diagnosis not present

## 2022-01-15 DIAGNOSIS — M461 Sacroiliitis, not elsewhere classified: Secondary | ICD-10-CM | POA: Diagnosis not present

## 2022-02-04 DIAGNOSIS — F909 Attention-deficit hyperactivity disorder, unspecified type: Secondary | ICD-10-CM | POA: Diagnosis not present

## 2022-02-04 DIAGNOSIS — J4520 Mild intermittent asthma, uncomplicated: Secondary | ICD-10-CM | POA: Diagnosis not present

## 2022-02-04 DIAGNOSIS — F411 Generalized anxiety disorder: Secondary | ICD-10-CM | POA: Diagnosis not present

## 2022-02-04 DIAGNOSIS — Z23 Encounter for immunization: Secondary | ICD-10-CM | POA: Diagnosis not present

## 2022-06-10 DIAGNOSIS — H2513 Age-related nuclear cataract, bilateral: Secondary | ICD-10-CM | POA: Diagnosis not present

## 2022-06-10 DIAGNOSIS — M450 Ankylosing spondylitis of multiple sites in spine: Secondary | ICD-10-CM | POA: Diagnosis not present

## 2022-06-10 DIAGNOSIS — H18459 Nodular corneal degeneration, unspecified eye: Secondary | ICD-10-CM | POA: Diagnosis not present

## 2022-06-10 DIAGNOSIS — H16299 Other keratoconjunctivitis, unspecified eye: Secondary | ICD-10-CM | POA: Diagnosis not present

## 2022-07-08 DIAGNOSIS — H18459 Nodular corneal degeneration, unspecified eye: Secondary | ICD-10-CM | POA: Diagnosis not present

## 2022-07-08 DIAGNOSIS — H16299 Other keratoconjunctivitis, unspecified eye: Secondary | ICD-10-CM | POA: Diagnosis not present

## 2022-08-13 DIAGNOSIS — Z111 Encounter for screening for respiratory tuberculosis: Secondary | ICD-10-CM | POA: Diagnosis not present

## 2022-08-13 DIAGNOSIS — M450 Ankylosing spondylitis of multiple sites in spine: Secondary | ICD-10-CM | POA: Diagnosis not present

## 2022-08-13 DIAGNOSIS — H209 Unspecified iridocyclitis: Secondary | ICD-10-CM | POA: Diagnosis not present

## 2022-08-13 DIAGNOSIS — M461 Sacroiliitis, not elsewhere classified: Secondary | ICD-10-CM | POA: Diagnosis not present

## 2022-08-20 DIAGNOSIS — J4520 Mild intermittent asthma, uncomplicated: Secondary | ICD-10-CM | POA: Diagnosis not present

## 2022-08-20 DIAGNOSIS — F909 Attention-deficit hyperactivity disorder, unspecified type: Secondary | ICD-10-CM | POA: Diagnosis not present

## 2022-08-20 DIAGNOSIS — F411 Generalized anxiety disorder: Secondary | ICD-10-CM | POA: Diagnosis not present
